# Patient Record
Sex: Female | Born: 1955 | Race: White | Hispanic: No | Marital: Single | Smoking: Never smoker
Health system: Southern US, Community
[De-identification: ages and names within clinical notes are randomized; demographics above are authoritative.]

## PROBLEM LIST (undated history)

## (undated) DIAGNOSIS — I10 Essential (primary) hypertension: Secondary | ICD-10-CM

---

## 2009-08-16 ENCOUNTER — Emergency Department (HOSPITAL_BASED_OUTPATIENT_CLINIC_OR_DEPARTMENT_OTHER): Admission: EM | Admit: 2009-08-16 | Discharge: 2009-08-16 | Payer: Self-pay | Admitting: Emergency Medicine

## 2009-08-16 ENCOUNTER — Ambulatory Visit: Payer: Self-pay | Admitting: Diagnostic Radiology

## 2015-04-25 DIAGNOSIS — Z1389 Encounter for screening for other disorder: Secondary | ICD-10-CM | POA: Diagnosis not present

## 2015-04-25 DIAGNOSIS — Z0000 Encounter for general adult medical examination without abnormal findings: Secondary | ICD-10-CM | POA: Diagnosis not present

## 2015-06-29 DIAGNOSIS — K210 Gastro-esophageal reflux disease with esophagitis: Secondary | ICD-10-CM | POA: Diagnosis not present

## 2015-07-15 DIAGNOSIS — Z1231 Encounter for screening mammogram for malignant neoplasm of breast: Secondary | ICD-10-CM | POA: Diagnosis not present

## 2015-07-22 DIAGNOSIS — K2270 Barrett's esophagus without dysplasia: Secondary | ICD-10-CM | POA: Diagnosis not present

## 2015-07-22 DIAGNOSIS — Z800 Family history of malignant neoplasm of digestive organs: Secondary | ICD-10-CM | POA: Diagnosis not present

## 2015-07-22 DIAGNOSIS — I10 Essential (primary) hypertension: Secondary | ICD-10-CM | POA: Diagnosis not present

## 2015-07-22 DIAGNOSIS — K449 Diaphragmatic hernia without obstruction or gangrene: Secondary | ICD-10-CM | POA: Diagnosis not present

## 2015-07-22 DIAGNOSIS — K2970 Gastritis, unspecified, without bleeding: Secondary | ICD-10-CM | POA: Diagnosis not present

## 2015-09-30 DIAGNOSIS — R05 Cough: Secondary | ICD-10-CM | POA: Diagnosis not present

## 2015-12-13 DIAGNOSIS — M5431 Sciatica, right side: Secondary | ICD-10-CM | POA: Diagnosis not present

## 2015-12-13 DIAGNOSIS — J40 Bronchitis, not specified as acute or chronic: Secondary | ICD-10-CM | POA: Diagnosis not present

## 2016-02-02 DIAGNOSIS — S8001XA Contusion of right knee, initial encounter: Secondary | ICD-10-CM | POA: Diagnosis not present

## 2016-04-04 DIAGNOSIS — G4700 Insomnia, unspecified: Secondary | ICD-10-CM | POA: Diagnosis not present

## 2016-04-04 DIAGNOSIS — M25561 Pain in right knee: Secondary | ICD-10-CM | POA: Diagnosis not present

## 2016-04-04 DIAGNOSIS — Z6828 Body mass index (BMI) 28.0-28.9, adult: Secondary | ICD-10-CM | POA: Diagnosis not present

## 2016-04-06 DIAGNOSIS — M2241 Chondromalacia patellae, right knee: Secondary | ICD-10-CM | POA: Diagnosis not present

## 2016-07-06 DIAGNOSIS — M25561 Pain in right knee: Secondary | ICD-10-CM | POA: Diagnosis not present

## 2016-07-06 DIAGNOSIS — M94261 Chondromalacia, right knee: Secondary | ICD-10-CM | POA: Diagnosis not present

## 2016-07-20 DIAGNOSIS — M25561 Pain in right knee: Secondary | ICD-10-CM | POA: Diagnosis not present

## 2016-07-23 DIAGNOSIS — K2270 Barrett's esophagus without dysplasia: Secondary | ICD-10-CM | POA: Diagnosis not present

## 2016-07-25 DIAGNOSIS — M94261 Chondromalacia, right knee: Secondary | ICD-10-CM | POA: Diagnosis not present

## 2016-08-13 DIAGNOSIS — Z1231 Encounter for screening mammogram for malignant neoplasm of breast: Secondary | ICD-10-CM | POA: Diagnosis not present

## 2016-08-24 DIAGNOSIS — N6092 Unspecified benign mammary dysplasia of left breast: Secondary | ICD-10-CM | POA: Diagnosis not present

## 2016-08-24 DIAGNOSIS — R922 Inconclusive mammogram: Secondary | ICD-10-CM | POA: Diagnosis not present

## 2016-09-19 DIAGNOSIS — R109 Unspecified abdominal pain: Secondary | ICD-10-CM | POA: Diagnosis not present

## 2016-09-19 DIAGNOSIS — Z79899 Other long term (current) drug therapy: Secondary | ICD-10-CM | POA: Diagnosis not present

## 2016-09-19 DIAGNOSIS — S39012A Strain of muscle, fascia and tendon of lower back, initial encounter: Secondary | ICD-10-CM | POA: Diagnosis not present

## 2016-09-19 DIAGNOSIS — R200 Anesthesia of skin: Secondary | ICD-10-CM | POA: Diagnosis not present

## 2016-09-19 DIAGNOSIS — N3000 Acute cystitis without hematuria: Secondary | ICD-10-CM | POA: Diagnosis not present

## 2016-09-19 DIAGNOSIS — M545 Low back pain: Secondary | ICD-10-CM | POA: Diagnosis not present

## 2016-09-19 DIAGNOSIS — I10 Essential (primary) hypertension: Secondary | ICD-10-CM | POA: Diagnosis not present

## 2017-01-07 DIAGNOSIS — Z0000 Encounter for general adult medical examination without abnormal findings: Secondary | ICD-10-CM | POA: Diagnosis not present

## 2017-01-07 DIAGNOSIS — Z1339 Encounter for screening examination for other mental health and behavioral disorders: Secondary | ICD-10-CM | POA: Diagnosis not present

## 2017-01-07 DIAGNOSIS — Z1331 Encounter for screening for depression: Secondary | ICD-10-CM | POA: Diagnosis not present

## 2017-01-07 DIAGNOSIS — Z6828 Body mass index (BMI) 28.0-28.9, adult: Secondary | ICD-10-CM | POA: Diagnosis not present

## 2017-09-10 DIAGNOSIS — Z1231 Encounter for screening mammogram for malignant neoplasm of breast: Secondary | ICD-10-CM | POA: Diagnosis not present

## 2017-10-09 DIAGNOSIS — K219 Gastro-esophageal reflux disease without esophagitis: Secondary | ICD-10-CM | POA: Diagnosis not present

## 2018-03-10 DIAGNOSIS — Z1331 Encounter for screening for depression: Secondary | ICD-10-CM | POA: Diagnosis not present

## 2018-03-10 DIAGNOSIS — Z13220 Encounter for screening for lipoid disorders: Secondary | ICD-10-CM | POA: Diagnosis not present

## 2018-03-10 DIAGNOSIS — Z6827 Body mass index (BMI) 27.0-27.9, adult: Secondary | ICD-10-CM | POA: Diagnosis not present

## 2018-03-10 DIAGNOSIS — Z01419 Encounter for gynecological examination (general) (routine) without abnormal findings: Secondary | ICD-10-CM | POA: Diagnosis not present

## 2018-06-18 DIAGNOSIS — K591 Functional diarrhea: Secondary | ICD-10-CM | POA: Diagnosis not present

## 2018-09-05 DIAGNOSIS — N390 Urinary tract infection, site not specified: Secondary | ICD-10-CM | POA: Diagnosis not present

## 2018-09-05 DIAGNOSIS — M545 Low back pain: Secondary | ICD-10-CM | POA: Diagnosis not present

## 2018-09-05 DIAGNOSIS — R109 Unspecified abdominal pain: Secondary | ICD-10-CM | POA: Diagnosis not present

## 2018-09-05 DIAGNOSIS — B9689 Other specified bacterial agents as the cause of diseases classified elsewhere: Secondary | ICD-10-CM | POA: Diagnosis not present

## 2018-09-05 DIAGNOSIS — R1110 Vomiting, unspecified: Secondary | ICD-10-CM | POA: Diagnosis not present

## 2018-09-05 DIAGNOSIS — R319 Hematuria, unspecified: Secondary | ICD-10-CM | POA: Diagnosis not present

## 2018-09-05 DIAGNOSIS — K5900 Constipation, unspecified: Secondary | ICD-10-CM | POA: Diagnosis not present

## 2018-09-17 DIAGNOSIS — K219 Gastro-esophageal reflux disease without esophagitis: Secondary | ICD-10-CM | POA: Diagnosis not present

## 2018-09-24 DIAGNOSIS — Z1231 Encounter for screening mammogram for malignant neoplasm of breast: Secondary | ICD-10-CM | POA: Diagnosis not present

## 2018-09-24 DIAGNOSIS — Z1239 Encounter for other screening for malignant neoplasm of breast: Secondary | ICD-10-CM | POA: Diagnosis not present

## 2018-10-16 DIAGNOSIS — Z9049 Acquired absence of other specified parts of digestive tract: Secondary | ICD-10-CM | POA: Diagnosis not present

## 2018-10-16 DIAGNOSIS — I10 Essential (primary) hypertension: Secondary | ICD-10-CM | POA: Diagnosis not present

## 2018-10-16 DIAGNOSIS — K2270 Barrett's esophagus without dysplasia: Secondary | ICD-10-CM | POA: Diagnosis not present

## 2018-10-16 DIAGNOSIS — K222 Esophageal obstruction: Secondary | ICD-10-CM | POA: Diagnosis not present

## 2018-10-16 DIAGNOSIS — K2210 Ulcer of esophagus without bleeding: Secondary | ICD-10-CM | POA: Diagnosis not present

## 2018-10-16 DIAGNOSIS — R1310 Dysphagia, unspecified: Secondary | ICD-10-CM | POA: Diagnosis not present

## 2018-10-16 DIAGNOSIS — Z79899 Other long term (current) drug therapy: Secondary | ICD-10-CM | POA: Diagnosis not present

## 2018-10-16 DIAGNOSIS — K219 Gastro-esophageal reflux disease without esophagitis: Secondary | ICD-10-CM | POA: Diagnosis not present

## 2018-10-16 DIAGNOSIS — K449 Diaphragmatic hernia without obstruction or gangrene: Secondary | ICD-10-CM | POA: Diagnosis not present

## 2018-10-16 DIAGNOSIS — K2970 Gastritis, unspecified, without bleeding: Secondary | ICD-10-CM | POA: Diagnosis not present

## 2018-10-16 DIAGNOSIS — Z800 Family history of malignant neoplasm of digestive organs: Secondary | ICD-10-CM | POA: Diagnosis not present

## 2018-10-16 DIAGNOSIS — K209 Esophagitis, unspecified without bleeding: Secondary | ICD-10-CM | POA: Diagnosis not present

## 2019-03-05 DIAGNOSIS — I10 Essential (primary) hypertension: Secondary | ICD-10-CM | POA: Diagnosis not present

## 2019-03-05 DIAGNOSIS — Z0000 Encounter for general adult medical examination without abnormal findings: Secondary | ICD-10-CM | POA: Diagnosis not present

## 2019-06-03 DIAGNOSIS — I10 Essential (primary) hypertension: Secondary | ICD-10-CM | POA: Diagnosis not present

## 2019-06-03 DIAGNOSIS — J0100 Acute maxillary sinusitis, unspecified: Secondary | ICD-10-CM | POA: Diagnosis not present

## 2019-06-03 DIAGNOSIS — L03211 Cellulitis of face: Secondary | ICD-10-CM | POA: Diagnosis not present

## 2019-06-18 DIAGNOSIS — Z23 Encounter for immunization: Secondary | ICD-10-CM | POA: Diagnosis not present

## 2019-06-18 DIAGNOSIS — M8580 Other specified disorders of bone density and structure, unspecified site: Secondary | ICD-10-CM | POA: Diagnosis not present

## 2019-06-18 DIAGNOSIS — Z0000 Encounter for general adult medical examination without abnormal findings: Secondary | ICD-10-CM | POA: Diagnosis not present

## 2019-06-18 DIAGNOSIS — Z6827 Body mass index (BMI) 27.0-27.9, adult: Secondary | ICD-10-CM | POA: Diagnosis not present

## 2019-08-28 DIAGNOSIS — K529 Noninfective gastroenteritis and colitis, unspecified: Secondary | ICD-10-CM | POA: Diagnosis not present

## 2019-08-28 DIAGNOSIS — Z20828 Contact with and (suspected) exposure to other viral communicable diseases: Secondary | ICD-10-CM | POA: Diagnosis not present

## 2019-09-05 ENCOUNTER — Encounter (HOSPITAL_BASED_OUTPATIENT_CLINIC_OR_DEPARTMENT_OTHER): Payer: Self-pay | Admitting: Emergency Medicine

## 2019-09-05 ENCOUNTER — Emergency Department (HOSPITAL_BASED_OUTPATIENT_CLINIC_OR_DEPARTMENT_OTHER): Payer: BC Managed Care – PPO

## 2019-09-05 ENCOUNTER — Emergency Department (HOSPITAL_BASED_OUTPATIENT_CLINIC_OR_DEPARTMENT_OTHER)
Admission: EM | Admit: 2019-09-05 | Discharge: 2019-09-05 | Disposition: A | Discharge status: Home / Self Care | Payer: BC Managed Care – PPO | Attending: Emergency Medicine | Admitting: Emergency Medicine

## 2019-09-05 ENCOUNTER — Other Ambulatory Visit: Payer: Self-pay

## 2019-09-05 DIAGNOSIS — M5030 Other cervical disc degeneration, unspecified cervical region: Secondary | ICD-10-CM | POA: Diagnosis not present

## 2019-09-05 DIAGNOSIS — R55 Syncope and collapse: Secondary | ICD-10-CM | POA: Diagnosis not present

## 2019-09-05 DIAGNOSIS — Z79899 Other long term (current) drug therapy: Secondary | ICD-10-CM | POA: Diagnosis not present

## 2019-09-05 DIAGNOSIS — S199XXA Unspecified injury of neck, initial encounter: Secondary | ICD-10-CM | POA: Diagnosis not present

## 2019-09-05 DIAGNOSIS — I10 Essential (primary) hypertension: Secondary | ICD-10-CM | POA: Diagnosis not present

## 2019-09-05 DIAGNOSIS — X58XXXA Exposure to other specified factors, initial encounter: Secondary | ICD-10-CM | POA: Insufficient documentation

## 2019-09-05 DIAGNOSIS — Y999 Unspecified external cause status: Secondary | ICD-10-CM | POA: Diagnosis not present

## 2019-09-05 DIAGNOSIS — T671XXA Heat syncope, initial encounter: Secondary | ICD-10-CM | POA: Diagnosis not present

## 2019-09-05 DIAGNOSIS — E860 Dehydration: Secondary | ICD-10-CM | POA: Insufficient documentation

## 2019-09-05 DIAGNOSIS — R42 Dizziness and giddiness: Secondary | ICD-10-CM | POA: Diagnosis not present

## 2019-09-05 DIAGNOSIS — Y929 Unspecified place or not applicable: Secondary | ICD-10-CM | POA: Insufficient documentation

## 2019-09-05 DIAGNOSIS — R112 Nausea with vomiting, unspecified: Secondary | ICD-10-CM | POA: Insufficient documentation

## 2019-09-05 DIAGNOSIS — R4020 Unspecified coma: Secondary | ICD-10-CM | POA: Diagnosis not present

## 2019-09-05 DIAGNOSIS — Y939 Activity, unspecified: Secondary | ICD-10-CM | POA: Diagnosis not present

## 2019-09-05 DIAGNOSIS — M4802 Spinal stenosis, cervical region: Secondary | ICD-10-CM | POA: Diagnosis not present

## 2019-09-05 DIAGNOSIS — R9431 Abnormal electrocardiogram [ECG] [EKG]: Secondary | ICD-10-CM | POA: Diagnosis not present

## 2019-09-05 DIAGNOSIS — I959 Hypotension, unspecified: Secondary | ICD-10-CM | POA: Diagnosis not present

## 2019-09-05 HISTORY — DX: Essential (primary) hypertension: I10

## 2019-09-05 LAB — URINALYSIS, ROUTINE W REFLEX MICROSCOPIC
Bilirubin Urine: NEGATIVE
Glucose, UA: NEGATIVE mg/dL
Hgb urine dipstick: NEGATIVE
Ketones, ur: NEGATIVE mg/dL
Nitrite: NEGATIVE
Protein, ur: NEGATIVE mg/dL
Specific Gravity, Urine: 1.02 (ref 1.005–1.030)
pH: 7 (ref 5.0–8.0)

## 2019-09-05 LAB — BASIC METABOLIC PANEL
Anion gap: 12 (ref 5–15)
BUN: 24 mg/dL — ABNORMAL HIGH (ref 8–23)
CO2: 26 mmol/L (ref 22–32)
Calcium: 9.7 mg/dL (ref 8.9–10.3)
Chloride: 101 mmol/L (ref 98–111)
Creatinine, Ser: 1.39 mg/dL — ABNORMAL HIGH (ref 0.44–1.00)
GFR calc Af Amer: 47 mL/min — ABNORMAL LOW (ref >60)
GFR calc non Af Amer: 41 mL/min — ABNORMAL LOW (ref >60)
Glucose, Bld: 118 mg/dL — ABNORMAL HIGH (ref 70–99)
Potassium: 3.5 mmol/L (ref 3.5–5.1)
Sodium: 139 mmol/L (ref 135–145)

## 2019-09-05 LAB — CBC WITH DIFFERENTIAL/PLATELET
Abs Immature Granulocytes: 0.02 10*3/uL (ref 0.00–0.07)
Basophils Absolute: 0 10*3/uL (ref 0.0–0.1)
Basophils Relative: 0 %
Eosinophils Absolute: 0.1 10*3/uL (ref 0.0–0.5)
Eosinophils Relative: 1 %
HCT: 39.5 % (ref 36.0–46.0)
Hemoglobin: 13.2 g/dL (ref 12.0–15.0)
Immature Granulocytes: 0 %
Lymphocytes Relative: 21 %
Lymphs Abs: 1.5 10*3/uL (ref 0.7–4.0)
MCH: 29.2 pg (ref 26.0–34.0)
MCHC: 33.4 g/dL (ref 30.0–36.0)
MCV: 87.4 fL (ref 80.0–100.0)
Monocytes Absolute: 0.5 10*3/uL (ref 0.1–1.0)
Monocytes Relative: 7 %
Neutro Abs: 4.9 10*3/uL (ref 1.7–7.7)
Neutrophils Relative %: 71 %
Platelets: 188 10*3/uL (ref 150–400)
RBC: 4.52 MIL/uL (ref 3.87–5.11)
RDW: 11.8 % (ref 11.5–15.5)
WBC: 7 10*3/uL (ref 4.0–10.5)
nRBC: 0 % (ref 0.0–0.2)

## 2019-09-05 LAB — URINALYSIS, MICROSCOPIC (REFLEX): RBC / HPF: NONE SEEN RBC/hpf (ref 0–5)

## 2019-09-05 MED ORDER — LACTATED RINGERS IV BOLUS
1000.0000 mL | Freq: Once | INTRAVENOUS | Status: AC
  Administered 2019-09-05: 1000 mL via INTRAVENOUS

## 2019-09-05 MED ORDER — ACETAMINOPHEN 500 MG PO TABS
1000.0000 mg | ORAL_TABLET | Freq: Once | ORAL | Status: AC
  Administered 2019-09-05: 1000 mg via ORAL
  Filled 2019-09-05: qty 2

## 2019-09-05 NOTE — ED Triage Notes (Addendum)
Pt to ED via EMS from work. Pt has been having n/v x 2 weeks and had syncopal episode at work. Pt received NS and Zofran 4 mg IV by EMS. Pt had negative Covid test 8/20.

## 2019-09-05 NOTE — ED Provider Notes (Signed)
[facility] Provider Note: Lowella Dell, MD, FACEP  CSN: [id number] MRN: [id number] ARRIVAL: 09/05/19 at 0257 ROOM: [facility]06/[facility]06   CHIEF COMPLAINT  Syncope   HISTORY OF PRESENT ILLNESS  09/05/19 3:15 AM Kayla Macdonald is a 64 y.o. female with 2 weeks of nausea and vomiting.  She had improved and returned to work yesterday evening (she works in a [employer]) for the first time after being sick.  She had a witnessed syncopal episode at work just prior to arrival.  She does not recall falling.  EMS gave her 500 mL of normal saline and 4 mg of Zofran IV prior to arrival.  She complains of generalized pain, most prominent in her neck, which she describes as aching and rates as a 10 out of 10 and worse with movement.  She tested negative for Covid on 08/28/2019.    Past Medical History:  Diagnosis Date  . Hypertension     History reviewed. No pertinent surgical history.  No family history on file.  Social History   Tobacco Use  . Smoking status: Never Smoker  . Smokeless tobacco: Never Used  Substance Use Topics  . Alcohol use: Never  . Drug use: Not on file    Prior to Admission medications   Medication Sig Start Date End Date Taking? Authorizing Provider  meloxicam (MOBIC) 15 MG tablet Take 15 mg by mouth daily. 06/18/19   [provider]  omeprazole (PRILOSEC) 40 MG capsule Take 40 mg by mouth daily. 08/14/19   [provider]  pravastatin (PRAVACHOL) 10 MG tablet Take 10 mg by mouth at bedtime. 06/18/19   [provider]    Allergies Codeine and Penicillins   REVIEW OF SYSTEMS  Negative except as noted here or in the History of Present Illness.   PHYSICAL EXAMINATION  Initial Vital Signs Blood pressure 127/87, pulse 82, temperature 97.9 F (36.6 C), temperature source Oral, resp. rate 16, height 5\' 6"  (1.676 m), weight 68 kg, SpO2 100 %.  Examination General: Well-developed, well-nourished female in no acute distress;  appearance consistent with age of record HENT: normocephalic; atraumatic Eyes: pupils equal, round and reactive to light; extraocular muscles intact Neck: supple; C-spine tenderness Heart: regular rate and rhythm Lungs: clear to auscultation bilaterally Abdomen: soft; nondistended; nontender; bowel sounds present Extremities: No deformity; full range of motion; pulses normal Neurologic: Awake, alert and oriented; motor function intact in all extremities and symmetric; no facial droop Skin: Warm and dry Psychiatric: Normal mood and affect   RESULTS  Summary of this visit's results, reviewed and interpreted by myself:   EKG Interpretation  Date/Time:  Saturday September 05 2019 03:09:34 EDT Ventricular Rate:  82 PR Interval:    QRS Duration: 108 QT Interval:  378 QTC Calculation: 442 R Axis:   -7 Text Interpretation: Sinus rhythm Abnormal R-wave progression, early transition Borderline T abnormalities, anterior leads Artifact No previous ECGs available Confirmed by [id number] (Paula Libra) on 09/05/2019 3:14:34 AM      Laboratory Studies: Results for orders placed or performed during the hospital encounter of 09/05/19 (from the past 24 hour(s))  CBC with Differential/Platelet     Status: None   Collection Time: 09/05/19  4:10 AM  Result Value Ref Range   WBC 7.0 4.0 - 10.5 K/uL   RBC 4.52 3.87 - 5.11 MIL/uL   Hemoglobin 13.2 12.0 - 15.0 g/dL   HCT 09/07/19 36 - 46 %   MCV 87.4 80.0 - 100.0 fL   MCH  29.2 26.0 - 34.0 pg   MCHC 33.4 30.0 - 36.0 g/dL   RDW 78.6 76.7 - 20.9 %   Platelets 188 150 - 400 K/uL   nRBC 0.0 0.0 - 0.2 %   Neutrophils Relative % 71 %   Neutro Abs 4.9 1.7 - 7.7 K/uL   Lymphocytes Relative 21 %   Lymphs Abs 1.5 0.7 - 4.0 K/uL   Monocytes Relative 7 %   Monocytes Absolute 0.5 0 - 1 K/uL   Eosinophils Relative 1 %   Eosinophils Absolute 0.1 0 - 0 K/uL   Basophils Relative 0 %   Basophils Absolute 0.0 0 - 0 K/uL   Immature Granulocytes 0 %   Abs Immature  Granulocytes 0.02 0.00 - 0.07 K/uL  Basic metabolic panel     Status: Abnormal   Collection Time: 09/05/19  4:10 AM  Result Value Ref Range   Sodium 139 135 - 145 mmol/L   Potassium 3.5 3.5 - 5.1 mmol/L   Chloride 101 98 - 111 mmol/L   CO2 26 22 - 32 mmol/L   Glucose, Bld 118 (H) 70 - 99 mg/dL   BUN 24 (H) 8 - 23 mg/dL   Creatinine, Ser 4.70 (H) 0.44 - 1.00 mg/dL   Calcium 9.7 8.9 - 96.2 mg/dL   GFR calc non Af Amer 41 (L) >60 mL/min   GFR calc Af Amer 47 (L) >60 mL/min   Anion gap 12 5 - 15  Urinalysis, Routine w reflex microscopic Urine, Clean Catch     Status: Abnormal   Collection Time: 09/05/19  5:10 AM  Result Value Ref Range   Color, Urine YELLOW YELLOW   APPearance HAZY (A) CLEAR   Specific Gravity, Urine 1.020 1.005 - 1.030   pH 7.0 5.0 - 8.0   Glucose, UA NEGATIVE NEGATIVE mg/dL   Hgb urine dipstick NEGATIVE NEGATIVE   Bilirubin Urine NEGATIVE NEGATIVE   Ketones, ur NEGATIVE NEGATIVE mg/dL   Protein, ur NEGATIVE NEGATIVE mg/dL   Nitrite NEGATIVE NEGATIVE   Leukocytes,Ua LARGE (A) NEGATIVE  Urinalysis, Microscopic (reflex)     Status: Abnormal   Collection Time: 09/05/19  5:10 AM  Result Value Ref Range   RBC / HPF NONE SEEN 0 - 5 RBC/hpf   WBC, UA 6-10 0 - 5 WBC/hpf   Bacteria, UA MANY (A) NONE SEEN   Squamous Epithelial / LPF 0-5 0 - 5   Non Squamous Epithelial PRESENT (A) NONE SEEN   Hyaline Casts, UA PRESENT    Granular Casts, UA PRESENT    Imaging Studies: CT Cervical Spine Wo Contrast  Result Date: 09/05/2019 CLINICAL DATA:  NECK TRAUMA. EXAM: CT CERVICAL SPINE WITHOUT CONTRAST TECHNIQUE: Multidetector CT imaging of the cervical spine was performed without intravenous contrast. Multiplanar CT image reconstructions were also generated. COMPARISON:  None. FINDINGS: Alignment: Normal Skull base and vertebrae: No acute fracture. No primary bone lesion or focal pathologic process. Soft tissues and spinal canal: No prevertebral fluid or swelling. No visible canal  hematoma. Disc levels: Moderate disc space narrowing and endplate spurring noted at C4-5. Mild disc space narrowing and endplate spurring noted at C5-6 and C6-7. Upper chest: Negative. Other: None. IMPRESSION: 1. No evidence for cervical spine fracture. 2. Cervical degenerative disc disease. Electronically Signed   By: Signa Kell M.D.   On: 09/05/2019 05:04    ED COURSE and MDM  Nursing notes, initial and subsequent vitals signs, including pulse oximetry, reviewed and interpreted by myself.  Vitals:   09/05/19  0306 09/05/19 0310  BP:  127/87  Pulse:  82  Resp:  16  Temp:  97.9 F (36.6 C)  TempSrc:  Oral  SpO2:  100%  Weight: 68 kg   Height: 5\' 6"  (1.676 m)    Medications  lactated ringers bolus 1,000 mL (1,000 mLs Intravenous New Bag/Given 09/05/19 0408)  acetaminophen (TYLENOL) tablet 1,000 mg (1,000 mg Oral Given 09/05/19 0425)   Syncope likely due to recent gastroenteritis with likely dehydration.  Patient was overheated at work and this likely contributed as well.  She has been rehydrated and feels better.  BUN and creatinine are consistent with a mild acute kidney injury.  This should improve with hydration as well.   PROCEDURES  Procedures   ED DIAGNOSES     ICD-10-CM   1. Heat syncope, initial encounter  T67.1XXA   2. Dehydration  E86.0        Keyuana Wank, 09/07/19, MD 09/05/19 [phone]

## 2019-09-07 ENCOUNTER — Other Ambulatory Visit: Payer: Self-pay

## 2019-09-07 ENCOUNTER — Encounter (HOSPITAL_BASED_OUTPATIENT_CLINIC_OR_DEPARTMENT_OTHER): Payer: Self-pay | Admitting: Emergency Medicine

## 2019-09-07 ENCOUNTER — Emergency Department (HOSPITAL_BASED_OUTPATIENT_CLINIC_OR_DEPARTMENT_OTHER)
Admission: EM | Admit: 2019-09-07 | Discharge: 2019-09-07 | Disposition: A | Discharge status: Home / Self Care | Payer: BC Managed Care – PPO | Attending: Emergency Medicine | Admitting: Emergency Medicine

## 2019-09-07 DIAGNOSIS — Z79899 Other long term (current) drug therapy: Secondary | ICD-10-CM | POA: Insufficient documentation

## 2019-09-07 DIAGNOSIS — R5383 Other fatigue: Secondary | ICD-10-CM

## 2019-09-07 DIAGNOSIS — E860 Dehydration: Secondary | ICD-10-CM | POA: Diagnosis not present

## 2019-09-07 DIAGNOSIS — R55 Syncope and collapse: Secondary | ICD-10-CM | POA: Insufficient documentation

## 2019-09-07 DIAGNOSIS — I10 Essential (primary) hypertension: Secondary | ICD-10-CM | POA: Diagnosis not present

## 2019-09-07 LAB — BASIC METABOLIC PANEL
Anion gap: 8 (ref 5–15)
BUN: 17 mg/dL (ref 8–23)
CO2: 29 mmol/L (ref 22–32)
Calcium: 9.5 mg/dL (ref 8.9–10.3)
Chloride: 100 mmol/L (ref 98–111)
Creatinine, Ser: 0.98 mg/dL (ref 0.44–1.00)
GFR calc Af Amer: >60 mL/min (ref >60)
GFR calc non Af Amer: >60 mL/min (ref >60)
Glucose, Bld: 115 mg/dL — ABNORMAL HIGH (ref 70–99)
Potassium: 4.3 mmol/L (ref 3.5–5.1)
Sodium: 137 mmol/L (ref 135–145)

## 2019-09-07 LAB — URINALYSIS, ROUTINE W REFLEX MICROSCOPIC
Bilirubin Urine: NEGATIVE
Glucose, UA: NEGATIVE mg/dL
Hgb urine dipstick: NEGATIVE
Ketones, ur: NEGATIVE mg/dL
Nitrite: NEGATIVE
Protein, ur: NEGATIVE mg/dL
Specific Gravity, Urine: 1.01 (ref 1.005–1.030)
pH: 7.5 (ref 5.0–8.0)

## 2019-09-07 LAB — CBC WITH DIFFERENTIAL/PLATELET
Abs Immature Granulocytes: 0.01 10*3/uL (ref 0.00–0.07)
Basophils Absolute: 0.1 10*3/uL (ref 0.0–0.1)
Basophils Relative: 1 %
Eosinophils Absolute: 0.1 10*3/uL (ref 0.0–0.5)
Eosinophils Relative: 3 %
HCT: 40.6 % (ref 36.0–46.0)
Hemoglobin: 13.3 g/dL (ref 12.0–15.0)
Immature Granulocytes: 0 %
Lymphocytes Relative: 45 %
Lymphs Abs: 1.9 10*3/uL (ref 0.7–4.0)
MCH: 29.2 pg (ref 26.0–34.0)
MCHC: 32.8 g/dL (ref 30.0–36.0)
MCV: 89 fL (ref 80.0–100.0)
Monocytes Absolute: 0.3 10*3/uL (ref 0.1–1.0)
Monocytes Relative: 8 %
Neutro Abs: 1.9 10*3/uL (ref 1.7–7.7)
Neutrophils Relative %: 43 %
Platelets: 197 10*3/uL (ref 150–400)
RBC: 4.56 MIL/uL (ref 3.87–5.11)
RDW: 11.7 % (ref 11.5–15.5)
WBC: 4.3 10*3/uL (ref 4.0–10.5)
nRBC: 0 % (ref 0.0–0.2)

## 2019-09-07 LAB — URINALYSIS, MICROSCOPIC (REFLEX)

## 2019-09-07 NOTE — ED Notes (Signed)
Resting quietly, questions re: DC to home asked, states she is comfortable except for a "slight" HA due to not eating or drinking.

## 2019-09-07 NOTE — Discharge Instructions (Signed)
Please schedule follow-up appointment with your primary doctor regarding symptoms experience today.  Return to ER if you develop passing out, chest pain, difficulty breathing or other new concerning symptom.

## 2019-09-07 NOTE — ED Provider Notes (Signed)
[facility] EMERGENCY DEPARTMENT Provider Note   CSN: [id number] Arrival date & time: 09/07/19  1049     History Chief Complaint  Patient presents with  . Fatigue    dehydration    Kayla Macdonald is a 64 y.o. female.  Presents to ER with concern for fatigue.  Per chart review, patient recent visit to ER for syncopal episode, thought to be related to dehydration.  Patient reports since this visit, she has not had any additional episodes of passing out, she just feels generally rundown and fatigued.  She denies any chest pain, difficulty breathing, nausea or vomiting.  She reports she does have a primary care doctor.  Has not seen primary care doctor for the symptoms.  HPI     Past Medical History:  Diagnosis Date  . Hypertension     There are no problems to display for this patient.   History reviewed. No pertinent surgical history.   OB History   No obstetric history on file.     No family history on file.  Social History   Tobacco Use  . Smoking status: Never Smoker  . Smokeless tobacco: Never Used  Substance Use Topics  . Alcohol use: Never  . Drug use: Not on file    Home Medications Prior to Admission medications   Medication Sig Start Date End Date Taking? Authorizing Provider  meloxicam (MOBIC) 15 MG tablet Take 15 mg by mouth daily. 06/18/19   [provider]  omeprazole (PRILOSEC) 40 MG capsule Take 40 mg by mouth daily. 08/14/19   [provider]  pravastatin (PRAVACHOL) 10 MG tablet Take 10 mg by mouth at bedtime. 06/18/19   [provider]    Allergies    Codeine and Penicillins  Review of Systems   Review of Systems  Constitutional: Positive for activity change and fatigue. Negative for chills and fever.  HENT: Negative for ear pain and sore throat.   Eyes: Negative for pain and visual disturbance.  Respiratory: Negative for cough and shortness of breath.   Cardiovascular: Negative for chest pain and  palpitations.  Gastrointestinal: Negative for abdominal pain and vomiting.  Genitourinary: Negative for dysuria and hematuria.  Musculoskeletal: Negative for arthralgias and back pain.  Skin: Negative for color change and rash.  Neurological: Negative for seizures and syncope.  All other systems reviewed and are negative.   Physical Exam Updated Vital Signs BP (!) 142/82 (BP Location: Left Arm)   Pulse 76   Temp 98.5 F (36.9 C) (Oral)   Resp 12   Ht 5\' 6"  (1.676 m)   Wt 68 kg   SpO2 100%   BMI 24.21 kg/m   Physical Exam Vitals and nursing note reviewed.  Constitutional:      General: She is not in acute distress.    Appearance: She is well-developed.  HENT:     Head: Normocephalic and atraumatic.  Eyes:     Conjunctiva/sclera: Conjunctivae normal.  Cardiovascular:     Rate and Rhythm: Normal rate and regular rhythm.     Heart sounds: No murmur heard.   Pulmonary:     Effort: Pulmonary effort is normal. No respiratory distress.     Breath sounds: Normal breath sounds.  Abdominal:     Palpations: Abdomen is soft.     Tenderness: There is no abdominal tenderness.  Musculoskeletal:     Cervical back: Neck supple.  Skin:    General: Skin is warm and dry.  Neurological:  General: No focal deficit present.     Mental Status: She is alert.     ED Results / Procedures / Treatments   Labs (all labs ordered are listed, but only abnormal results are displayed) Labs Reviewed  BASIC METABOLIC PANEL - Abnormal; Notable for the following components:      Result Value   Glucose, Bld 115 (*)    All other components within normal limits  URINALYSIS, ROUTINE W REFLEX MICROSCOPIC - Abnormal; Notable for the following components:   Leukocytes,Ua SMALL (*)    All other components within normal limits  URINALYSIS, MICROSCOPIC (REFLEX) - Abnormal; Notable for the following components:   Bacteria, UA RARE (*)    All other components within normal limits  CBC WITH  DIFFERENTIAL/PLATELET    EKG EKG Interpretation  Date/Time:  Monday September 07 2019 13:04:28 EDT Ventricular Rate:  66 PR Interval:    QRS Duration: 88 QT Interval:  397 QTC Calculation: 416 R Axis:   6 Text Interpretation: Sinus rhythm Low voltage, precordial leads Abnormal R-wave progression, early transition Nonspecific T abnormalities, anterior leads Confirmed by Marianna Fuss ([id number]) on 09/07/2019 1:43:46 PM   Radiology No results found.  Procedures Procedures (including critical care)  Medications Ordered in ED Medications - No data to display  ED Course  I have reviewed the triage vital signs and the nursing notes.  Pertinent labs & imaging results that were available during my care of the patient were reviewed by me and considered in my medical decision making (see chart for details).    MDM Rules/Calculators/A&P                          64 year old lady presents to ER with concern for fatigue, recent ER visit for syncope.  In ER today patient is noted to be remarkably well-appearing, she has stable vital signs, repeat basic labs were grossly stable.  She denies any further episodes of syncope.  No events on telemetry monitoring in ER.  Given her current well appearance, and work-up today, believe patient can be discharged home and follow-up primary care doctor.    After the discussed management above, the patient was determined to be safe for discharge.  The patient was in agreement with this plan and all questions regarding their care were answered.  ED return precautions were discussed and the patient will return to the ED with any significant worsening of condition.   Final Clinical Impression(s) / ED Diagnoses Final diagnoses:  Fatigue, unspecified type    Rx / DC Orders ED Discharge Orders    None       Milagros Loll, MD 09/08/19 1157

## 2019-09-07 NOTE — ED Triage Notes (Signed)
Was seen on 8/28 for syncope, needs medical clearance to go back to work. sts feels nauseated and fatigued . No further symptoms

## 2019-09-09 DIAGNOSIS — R064 Hyperventilation: Secondary | ICD-10-CM | POA: Diagnosis not present

## 2019-09-09 DIAGNOSIS — R0689 Other abnormalities of breathing: Secondary | ICD-10-CM | POA: Diagnosis not present

## 2019-09-09 DIAGNOSIS — R112 Nausea with vomiting, unspecified: Secondary | ICD-10-CM | POA: Diagnosis not present

## 2019-09-09 DIAGNOSIS — R000 Tachycardia, unspecified: Secondary | ICD-10-CM | POA: Diagnosis not present

## 2019-09-09 DIAGNOSIS — R202 Paresthesia of skin: Secondary | ICD-10-CM | POA: Diagnosis not present

## 2019-09-28 DIAGNOSIS — Z1231 Encounter for screening mammogram for malignant neoplasm of breast: Secondary | ICD-10-CM | POA: Diagnosis not present

## 2019-09-28 DIAGNOSIS — Z1239 Encounter for other screening for malignant neoplasm of breast: Secondary | ICD-10-CM | POA: Diagnosis not present

## 2020-02-02 DIAGNOSIS — M25561 Pain in right knee: Secondary | ICD-10-CM | POA: Diagnosis not present

## 2020-07-21 DIAGNOSIS — Z0000 Encounter for general adult medical examination without abnormal findings: Secondary | ICD-10-CM | POA: Diagnosis not present

## 2020-07-21 DIAGNOSIS — Z13220 Encounter for screening for lipoid disorders: Secondary | ICD-10-CM | POA: Diagnosis not present

## 2020-07-21 DIAGNOSIS — Z1331 Encounter for screening for depression: Secondary | ICD-10-CM | POA: Diagnosis not present

## 2020-07-21 DIAGNOSIS — Z6827 Body mass index (BMI) 27.0-27.9, adult: Secondary | ICD-10-CM | POA: Diagnosis not present

## 2020-09-29 DIAGNOSIS — Z1239 Encounter for other screening for malignant neoplasm of breast: Secondary | ICD-10-CM | POA: Diagnosis not present

## 2020-09-29 DIAGNOSIS — Z1231 Encounter for screening mammogram for malignant neoplasm of breast: Secondary | ICD-10-CM | POA: Diagnosis not present

## 2021-03-16 DIAGNOSIS — M25561 Pain in right knee: Secondary | ICD-10-CM | POA: Diagnosis not present

## 2021-03-16 DIAGNOSIS — M1711 Unilateral primary osteoarthritis, right knee: Secondary | ICD-10-CM | POA: Diagnosis not present

## 2021-03-17 DIAGNOSIS — M1711 Unilateral primary osteoarthritis, right knee: Secondary | ICD-10-CM | POA: Diagnosis not present

## 2021-05-22 DIAGNOSIS — T7840XA Allergy, unspecified, initial encounter: Secondary | ICD-10-CM | POA: Diagnosis not present

## 2021-05-22 DIAGNOSIS — R112 Nausea with vomiting, unspecified: Secondary | ICD-10-CM | POA: Diagnosis not present

## 2021-07-24 DIAGNOSIS — Z13220 Encounter for screening for lipoid disorders: Secondary | ICD-10-CM | POA: Diagnosis not present

## 2021-07-24 DIAGNOSIS — M8580 Other specified disorders of bone density and structure, unspecified site: Secondary | ICD-10-CM | POA: Diagnosis not present

## 2021-07-24 DIAGNOSIS — Z0000 Encounter for general adult medical examination without abnormal findings: Secondary | ICD-10-CM | POA: Diagnosis not present

## 2021-07-24 DIAGNOSIS — Z1331 Encounter for screening for depression: Secondary | ICD-10-CM | POA: Diagnosis not present

## 2021-07-24 DIAGNOSIS — Z6827 Body mass index (BMI) 27.0-27.9, adult: Secondary | ICD-10-CM | POA: Diagnosis not present

## 2021-07-25 DIAGNOSIS — M8580 Other specified disorders of bone density and structure, unspecified site: Secondary | ICD-10-CM | POA: Diagnosis not present

## 2021-10-05 DIAGNOSIS — Z1231 Encounter for screening mammogram for malignant neoplasm of breast: Secondary | ICD-10-CM | POA: Diagnosis not present

## 2021-10-14 DIAGNOSIS — R0981 Nasal congestion: Secondary | ICD-10-CM | POA: Diagnosis not present

## 2021-10-14 DIAGNOSIS — R509 Fever, unspecified: Secondary | ICD-10-CM | POA: Diagnosis not present

## 2021-10-14 DIAGNOSIS — K591 Functional diarrhea: Secondary | ICD-10-CM | POA: Diagnosis not present

## 2021-10-14 DIAGNOSIS — M791 Myalgia, unspecified site: Secondary | ICD-10-CM | POA: Diagnosis not present

## 2021-10-14 DIAGNOSIS — R051 Acute cough: Secondary | ICD-10-CM | POA: Diagnosis not present

## 2021-10-14 DIAGNOSIS — J029 Acute pharyngitis, unspecified: Secondary | ICD-10-CM | POA: Diagnosis not present

## 2021-10-19 DIAGNOSIS — R509 Fever, unspecified: Secondary | ICD-10-CM | POA: Diagnosis not present

## 2021-10-19 DIAGNOSIS — R051 Acute cough: Secondary | ICD-10-CM | POA: Diagnosis not present

## 2021-10-19 DIAGNOSIS — R0981 Nasal congestion: Secondary | ICD-10-CM | POA: Diagnosis not present

## 2021-10-19 DIAGNOSIS — R5381 Other malaise: Secondary | ICD-10-CM | POA: Diagnosis not present

## 2022-02-26 DIAGNOSIS — R0981 Nasal congestion: Secondary | ICD-10-CM | POA: Diagnosis not present

## 2022-02-26 DIAGNOSIS — R0602 Shortness of breath: Secondary | ICD-10-CM | POA: Diagnosis not present

## 2022-02-26 DIAGNOSIS — R051 Acute cough: Secondary | ICD-10-CM | POA: Diagnosis not present

## 2022-02-26 DIAGNOSIS — J209 Acute bronchitis, unspecified: Secondary | ICD-10-CM | POA: Diagnosis not present

## 2022-02-26 DIAGNOSIS — R509 Fever, unspecified: Secondary | ICD-10-CM | POA: Diagnosis not present

## 2022-04-10 IMAGING — CT CT CERVICAL SPINE W/O CM
3 of 4 series · 13 of 33 positions shown, 16 images · non-contrast
Comparison: None.

CLINICAL DATA: NECK TRAUMA.

EXAM:
CT CERVICAL SPINE WITHOUT CONTRAST
TECHNIQUE: Multidetector CT imaging of the cervical spine was performed without
intravenous contrast. Multiplanar CT image reconstructions were also
generated.

[Series 4: sagittal bone · sagittal · 0.29mm/px · 5 of 61 slices shown, 6 images]
[im 21/61  bone]
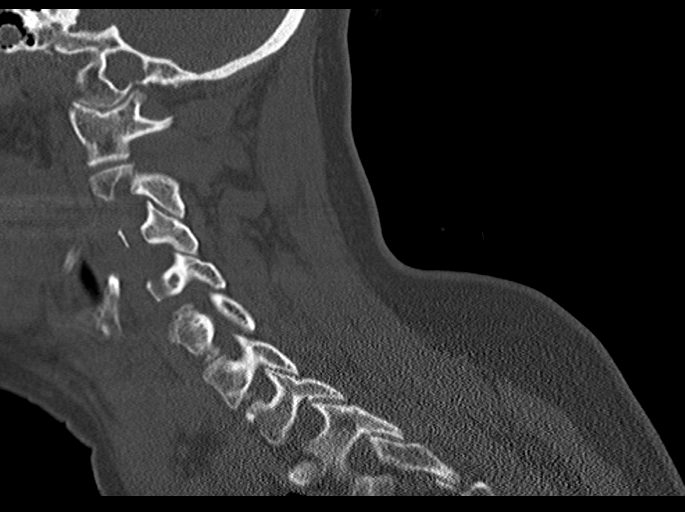
[im 26/61  bone]
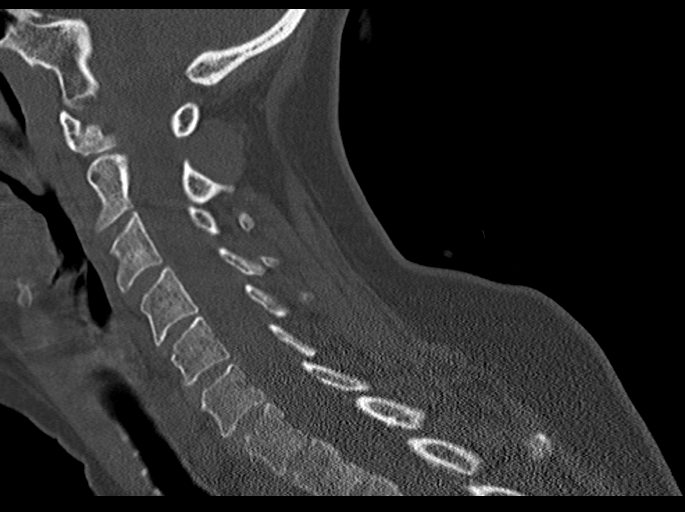
[im 31/61  soft-tissue]
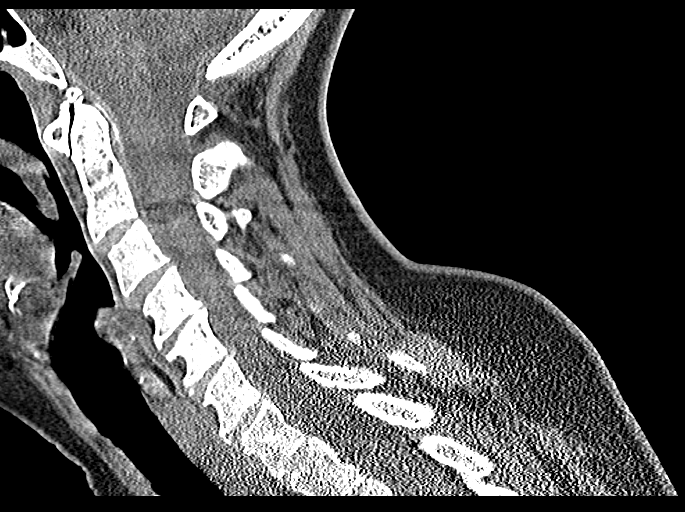
[im 31/61  bone]
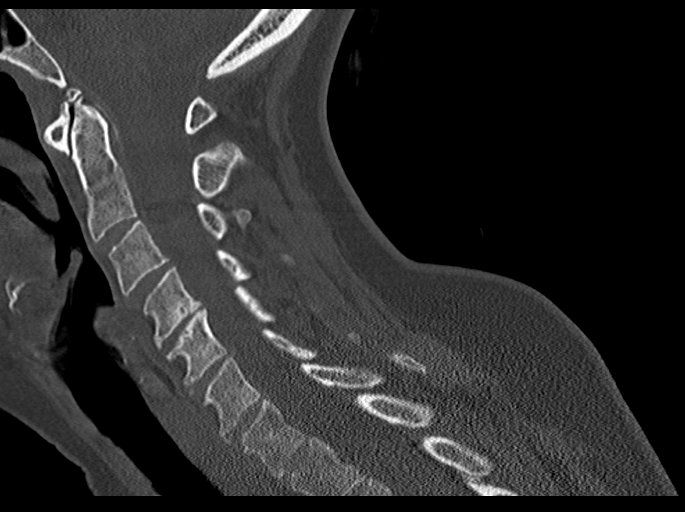
[im 36/61  bone]
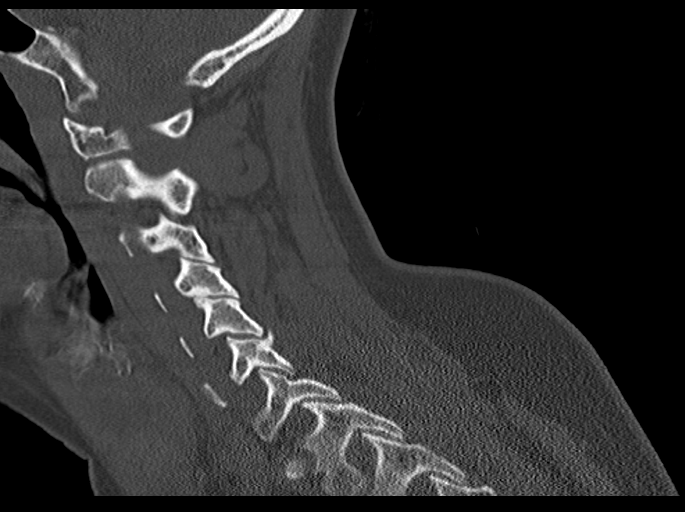
[im 41/61  bone]
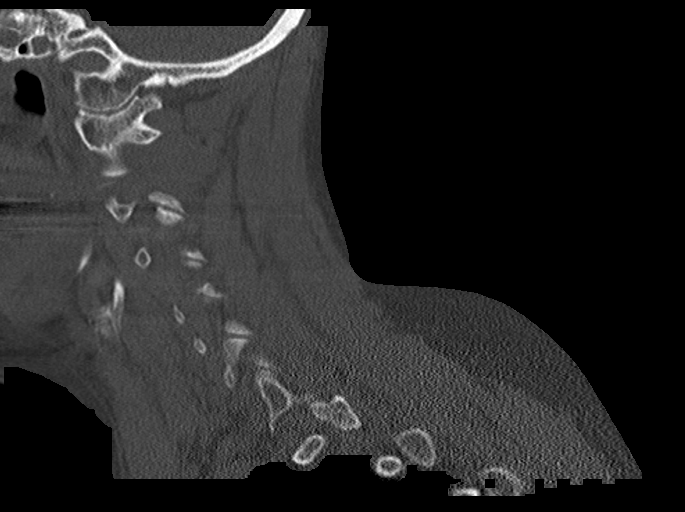

[Series 5: coronal bone · coronal · 0.23mm/px · 3 of 59 slices shown]
[im 12/59  bone]
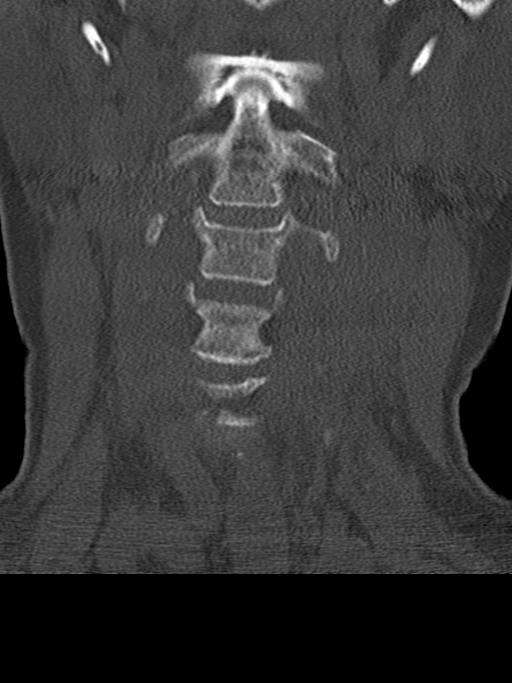
[im 24/59  bone]
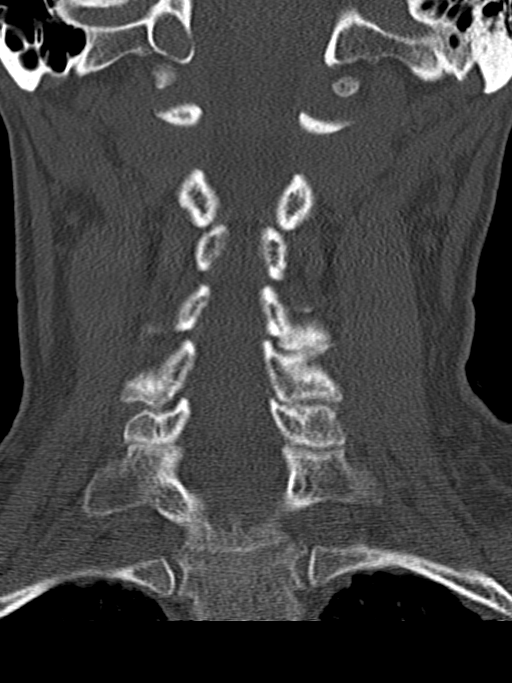
[im 35/59  bone]
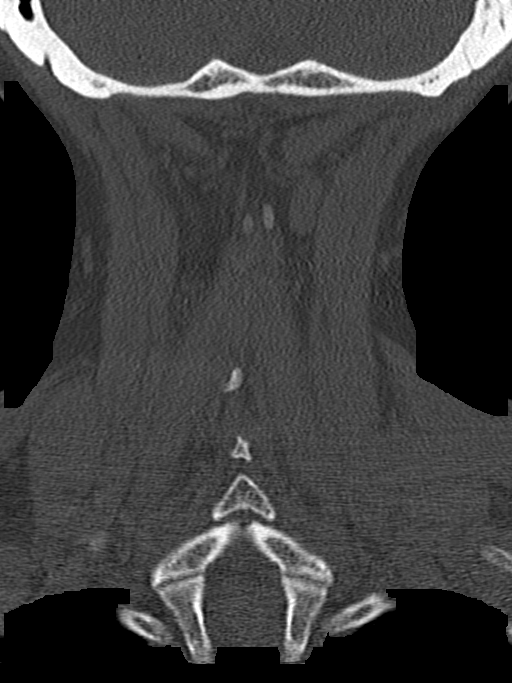

[Series 6: orthogonal bone · axial · 0.23mm/px · z∈[+660,+742]mm · 5 of 69 slices shown, 7 images]
[im 12/69  soft-tissue]
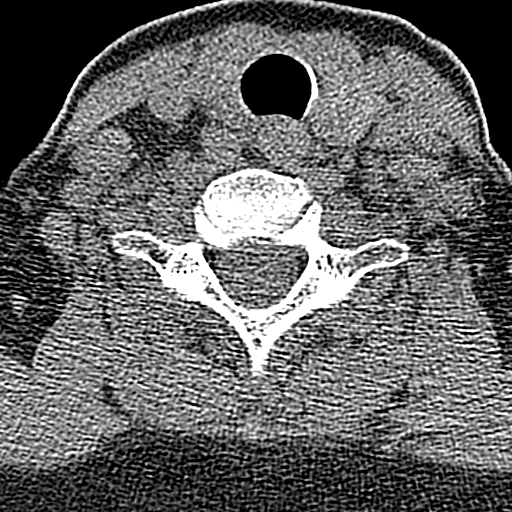
[im 12/69  bone]
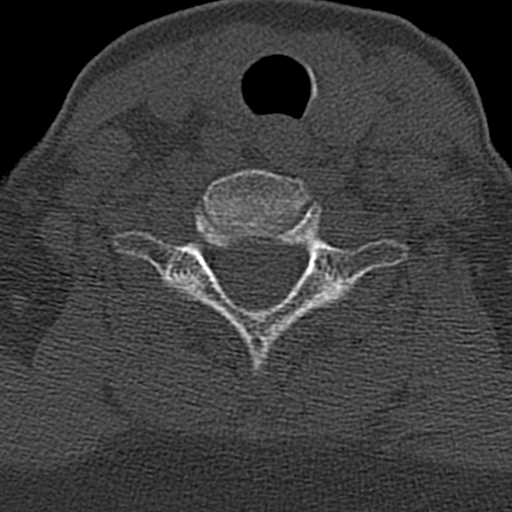
[im 23/69  bone]
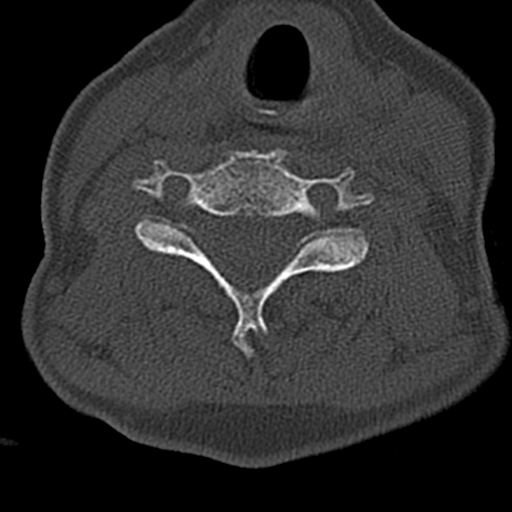
[im 35/69  bone]
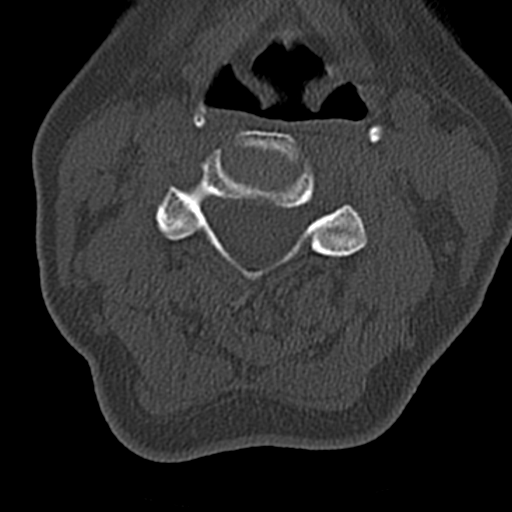
[im 46/69  bone]
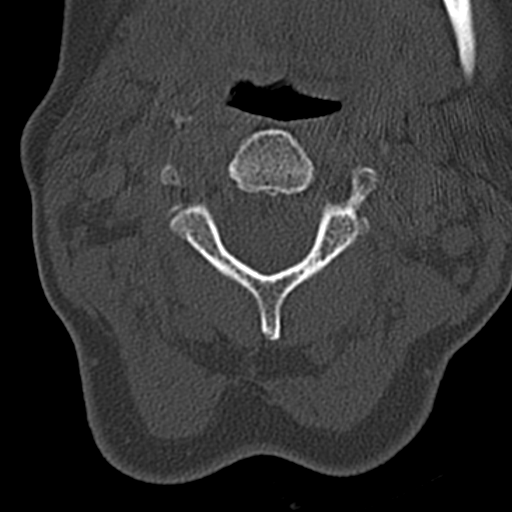
[im 57/69  soft-tissue]
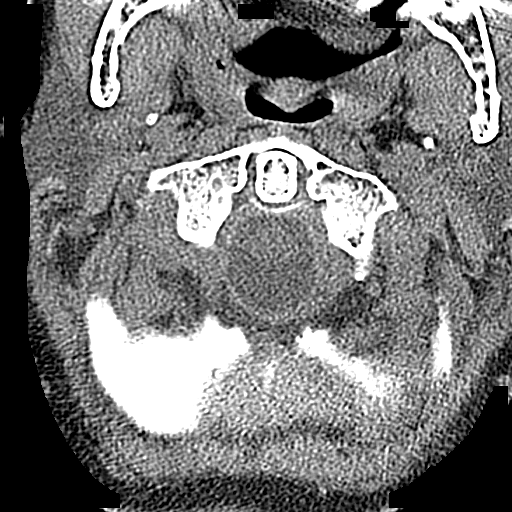
[im 57/69  bone]
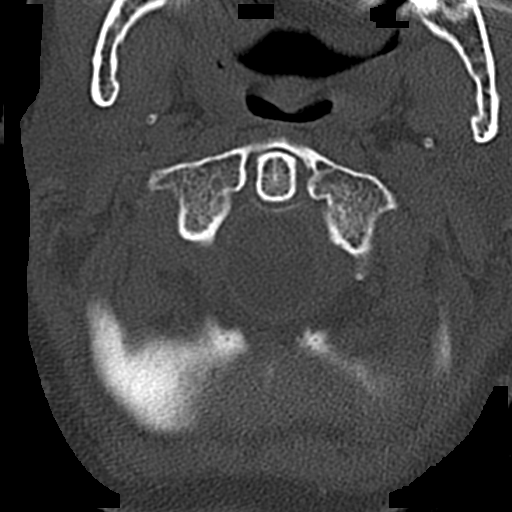

[13 of 33 positions shown; findings below may reference images not displayed]

FINDINGS: Alignment: Normal

Skull base and vertebrae: No acute fracture. No primary bone lesion
or focal pathologic process.

Soft tissues and spinal canal: No prevertebral fluid or swelling. No
visible canal hematoma.

Disc levels: Moderate disc space narrowing and endplate spurring
noted at C4-5. Mild disc space narrowing and endplate spurring noted
at C5-6 and C6-7.

Upper chest: Negative.

Other: None.
IMPRESSION: 1. No evidence for cervical spine fracture.
2. Cervical degenerative disc disease.

## 2022-05-06 DIAGNOSIS — R197 Diarrhea, unspecified: Secondary | ICD-10-CM | POA: Diagnosis not present

## 2022-05-06 DIAGNOSIS — E876 Hypokalemia: Secondary | ICD-10-CM | POA: Diagnosis not present

## 2022-05-06 DIAGNOSIS — R1110 Vomiting, unspecified: Secondary | ICD-10-CM | POA: Diagnosis not present

## 2022-05-06 DIAGNOSIS — Z79899 Other long term (current) drug therapy: Secondary | ICD-10-CM | POA: Diagnosis not present

## 2022-05-06 DIAGNOSIS — K5730 Diverticulosis of large intestine without perforation or abscess without bleeding: Secondary | ICD-10-CM | POA: Diagnosis not present

## 2022-05-06 DIAGNOSIS — K591 Functional diarrhea: Secondary | ICD-10-CM | POA: Diagnosis not present

## 2022-05-06 DIAGNOSIS — R109 Unspecified abdominal pain: Secondary | ICD-10-CM | POA: Diagnosis not present

## 2022-05-06 DIAGNOSIS — R112 Nausea with vomiting, unspecified: Secondary | ICD-10-CM | POA: Diagnosis not present

## 2022-07-27 DIAGNOSIS — Z0000 Encounter for general adult medical examination without abnormal findings: Secondary | ICD-10-CM | POA: Diagnosis not present

## 2022-07-31 DIAGNOSIS — R112 Nausea with vomiting, unspecified: Secondary | ICD-10-CM | POA: Diagnosis not present

## 2022-08-14 DIAGNOSIS — M8580 Other specified disorders of bone density and structure, unspecified site: Secondary | ICD-10-CM | POA: Diagnosis not present

## 2022-09-29 DIAGNOSIS — R1084 Generalized abdominal pain: Secondary | ICD-10-CM | POA: Diagnosis not present

## 2022-11-07 DIAGNOSIS — Z1231 Encounter for screening mammogram for malignant neoplasm of breast: Secondary | ICD-10-CM | POA: Diagnosis not present

## 2022-11-07 DIAGNOSIS — Z1239 Encounter for other screening for malignant neoplasm of breast: Secondary | ICD-10-CM | POA: Diagnosis not present

## 2023-03-18 DIAGNOSIS — K219 Gastro-esophageal reflux disease without esophagitis: Secondary | ICD-10-CM | POA: Diagnosis not present

## 2023-03-18 DIAGNOSIS — K2270 Barrett's esophagus without dysplasia: Secondary | ICD-10-CM | POA: Diagnosis not present

## 2023-03-18 DIAGNOSIS — K649 Unspecified hemorrhoids: Secondary | ICD-10-CM | POA: Diagnosis not present

## 2023-06-20 DIAGNOSIS — R110 Nausea: Secondary | ICD-10-CM | POA: Diagnosis not present

## 2023-06-20 DIAGNOSIS — R9431 Abnormal electrocardiogram [ECG] [EKG]: Secondary | ICD-10-CM | POA: Diagnosis not present

## 2023-06-20 DIAGNOSIS — R55 Syncope and collapse: Secondary | ICD-10-CM | POA: Diagnosis not present

## 2023-06-20 DIAGNOSIS — Z79899 Other long term (current) drug therapy: Secondary | ICD-10-CM | POA: Diagnosis not present

## 2023-06-20 DIAGNOSIS — E860 Dehydration: Secondary | ICD-10-CM | POA: Diagnosis not present

## 2023-06-20 DIAGNOSIS — R531 Weakness: Secondary | ICD-10-CM | POA: Diagnosis not present

## 2023-06-20 DIAGNOSIS — R202 Paresthesia of skin: Secondary | ICD-10-CM | POA: Diagnosis not present

## 2023-06-22 DIAGNOSIS — K529 Noninfective gastroenteritis and colitis, unspecified: Secondary | ICD-10-CM | POA: Diagnosis not present

## 2023-06-22 DIAGNOSIS — R112 Nausea with vomiting, unspecified: Secondary | ICD-10-CM | POA: Diagnosis not present

## 2023-07-15 DIAGNOSIS — R1310 Dysphagia, unspecified: Secondary | ICD-10-CM | POA: Diagnosis not present

## 2023-07-15 DIAGNOSIS — K649 Unspecified hemorrhoids: Secondary | ICD-10-CM | POA: Diagnosis not present

## 2023-07-15 DIAGNOSIS — K219 Gastro-esophageal reflux disease without esophagitis: Secondary | ICD-10-CM | POA: Diagnosis not present

## 2023-07-15 DIAGNOSIS — K2270 Barrett's esophagus without dysplasia: Secondary | ICD-10-CM | POA: Diagnosis not present

## 2023-07-30 DIAGNOSIS — M8580 Other specified disorders of bone density and structure, unspecified site: Secondary | ICD-10-CM | POA: Diagnosis not present

## 2023-07-30 DIAGNOSIS — Z01419 Encounter for gynecological examination (general) (routine) without abnormal findings: Secondary | ICD-10-CM | POA: Diagnosis not present

## 2023-07-30 DIAGNOSIS — Z6825 Body mass index (BMI) 25.0-25.9, adult: Secondary | ICD-10-CM | POA: Diagnosis not present

## 2023-07-31 DIAGNOSIS — M25511 Pain in right shoulder: Secondary | ICD-10-CM | POA: Diagnosis not present

## 2023-07-31 DIAGNOSIS — M4692 Unspecified inflammatory spondylopathy, cervical region: Secondary | ICD-10-CM | POA: Diagnosis not present

## 2023-07-31 DIAGNOSIS — M25512 Pain in left shoulder: Secondary | ICD-10-CM | POA: Diagnosis not present

## 2023-07-31 DIAGNOSIS — M67819 Other specified disorders of synovium and tendon, unspecified shoulder: Secondary | ICD-10-CM | POA: Diagnosis not present

## 2023-08-02 DIAGNOSIS — I10 Essential (primary) hypertension: Secondary | ICD-10-CM | POA: Diagnosis not present

## 2023-08-02 DIAGNOSIS — R42 Dizziness and giddiness: Secondary | ICD-10-CM | POA: Diagnosis not present

## 2023-08-02 DIAGNOSIS — R0902 Hypoxemia: Secondary | ICD-10-CM | POA: Diagnosis not present

## 2023-08-02 DIAGNOSIS — R1111 Vomiting without nausea: Secondary | ICD-10-CM | POA: Diagnosis not present

## 2023-08-03 DIAGNOSIS — I951 Orthostatic hypotension: Secondary | ICD-10-CM | POA: Diagnosis not present

## 2023-08-03 DIAGNOSIS — R001 Bradycardia, unspecified: Secondary | ICD-10-CM | POA: Diagnosis not present

## 2023-08-03 DIAGNOSIS — R824 Acetonuria: Secondary | ICD-10-CM | POA: Diagnosis not present

## 2023-08-03 DIAGNOSIS — R5381 Other malaise: Secondary | ICD-10-CM | POA: Diagnosis not present

## 2023-08-03 DIAGNOSIS — R42 Dizziness and giddiness: Secondary | ICD-10-CM | POA: Diagnosis not present

## 2023-08-03 DIAGNOSIS — R112 Nausea with vomiting, unspecified: Secondary | ICD-10-CM | POA: Diagnosis not present

## 2023-08-03 DIAGNOSIS — E860 Dehydration: Secondary | ICD-10-CM | POA: Diagnosis not present

## 2023-08-03 DIAGNOSIS — I10 Essential (primary) hypertension: Secondary | ICD-10-CM | POA: Diagnosis not present

## 2023-08-03 DIAGNOSIS — R5383 Other fatigue: Secondary | ICD-10-CM | POA: Diagnosis not present

## 2023-08-03 DIAGNOSIS — R630 Anorexia: Secondary | ICD-10-CM | POA: Diagnosis not present

## 2023-08-03 DIAGNOSIS — R809 Proteinuria, unspecified: Secondary | ICD-10-CM | POA: Diagnosis not present

## 2023-08-06 DIAGNOSIS — Z6825 Body mass index (BMI) 25.0-25.9, adult: Secondary | ICD-10-CM | POA: Diagnosis not present

## 2023-08-06 DIAGNOSIS — K5900 Constipation, unspecified: Secondary | ICD-10-CM | POA: Diagnosis not present

## 2023-08-06 DIAGNOSIS — R112 Nausea with vomiting, unspecified: Secondary | ICD-10-CM | POA: Diagnosis not present

## 2023-08-11 DIAGNOSIS — S43432A Superior glenoid labrum lesion of left shoulder, initial encounter: Secondary | ICD-10-CM | POA: Diagnosis not present

## 2023-08-11 DIAGNOSIS — M75112 Incomplete rotator cuff tear or rupture of left shoulder, not specified as traumatic: Secondary | ICD-10-CM | POA: Diagnosis not present

## 2023-08-11 DIAGNOSIS — M25511 Pain in right shoulder: Secondary | ICD-10-CM | POA: Diagnosis not present

## 2023-08-11 DIAGNOSIS — M25512 Pain in left shoulder: Secondary | ICD-10-CM | POA: Diagnosis not present

## 2023-09-20 DIAGNOSIS — M5412 Radiculopathy, cervical region: Secondary | ICD-10-CM | POA: Diagnosis not present

## 2023-09-20 DIAGNOSIS — M75112 Incomplete rotator cuff tear or rupture of left shoulder, not specified as traumatic: Secondary | ICD-10-CM | POA: Diagnosis not present

## 2023-09-20 DIAGNOSIS — M4692 Unspecified inflammatory spondylopathy, cervical region: Secondary | ICD-10-CM | POA: Diagnosis not present

## 2023-09-24 DIAGNOSIS — M5412 Radiculopathy, cervical region: Secondary | ICD-10-CM | POA: Diagnosis not present

## 2023-09-24 DIAGNOSIS — M67819 Other specified disorders of synovium and tendon, unspecified shoulder: Secondary | ICD-10-CM | POA: Diagnosis not present

## 2023-09-24 DIAGNOSIS — M4692 Unspecified inflammatory spondylopathy, cervical region: Secondary | ICD-10-CM | POA: Diagnosis not present

## 2023-11-06 DIAGNOSIS — S0093XA Contusion of unspecified part of head, initial encounter: Secondary | ICD-10-CM | POA: Diagnosis not present

## 2023-11-06 DIAGNOSIS — M25512 Pain in left shoulder: Secondary | ICD-10-CM | POA: Diagnosis not present

## 2023-11-06 DIAGNOSIS — R079 Chest pain, unspecified: Secondary | ICD-10-CM | POA: Diagnosis not present
